# Patient Record
Sex: Male | Born: 1950 | Race: Black or African American | Hispanic: No | Marital: Married | Smoking: Current every day smoker
Health system: Southern US, Community
[De-identification: ages and names within clinical notes are randomized; demographics above are authoritative.]

## PROBLEM LIST (undated history)

## (undated) DIAGNOSIS — K219 Gastro-esophageal reflux disease without esophagitis: Secondary | ICD-10-CM

## (undated) HISTORY — PX: BACK SURGERY: SHX140

---

## 1998-02-05 ENCOUNTER — Encounter: Payer: Self-pay | Admitting: Emergency Medicine

## 1998-02-05 ENCOUNTER — Emergency Department (HOSPITAL_COMMUNITY): Admission: EM | Admit: 1998-02-05 | Discharge: 1998-02-05 | Payer: Self-pay | Admitting: Emergency Medicine

## 2001-06-10 ENCOUNTER — Emergency Department (HOSPITAL_COMMUNITY): Admission: EM | Admit: 2001-06-10 | Discharge: 2001-06-10 | Payer: Self-pay | Admitting: Emergency Medicine

## 2001-06-10 ENCOUNTER — Encounter: Payer: Self-pay | Admitting: Emergency Medicine

## 2001-06-11 ENCOUNTER — Ambulatory Visit (HOSPITAL_COMMUNITY): Admission: RE | Admit: 2001-06-11 | Discharge: 2001-06-11 | Payer: Self-pay | Admitting: Emergency Medicine

## 2001-06-11 ENCOUNTER — Encounter: Payer: Self-pay | Admitting: Emergency Medicine

## 2001-10-10 ENCOUNTER — Ambulatory Visit (HOSPITAL_COMMUNITY): Admission: RE | Admit: 2001-10-10 | Discharge: 2001-10-11 | Payer: Self-pay | Admitting: Neurosurgery

## 2001-10-10 ENCOUNTER — Encounter: Payer: Self-pay | Admitting: Neurosurgery

## 2006-08-05 ENCOUNTER — Emergency Department (HOSPITAL_COMMUNITY): Admission: EM | Admit: 2006-08-05 | Discharge: 2006-08-05 | Payer: Self-pay | Admitting: Emergency Medicine

## 2011-10-08 ENCOUNTER — Emergency Department (HOSPITAL_COMMUNITY)
Admission: EM | Admit: 2011-10-08 | Discharge: 2011-10-08 | Disposition: A | Discharge status: Home / Self Care | Payer: Self-pay | Attending: Emergency Medicine | Admitting: Emergency Medicine

## 2011-10-08 ENCOUNTER — Encounter (HOSPITAL_COMMUNITY): Payer: Self-pay | Admitting: *Deleted

## 2011-10-08 DIAGNOSIS — R1013 Epigastric pain: Secondary | ICD-10-CM | POA: Insufficient documentation

## 2011-10-08 DIAGNOSIS — K219 Gastro-esophageal reflux disease without esophagitis: Secondary | ICD-10-CM | POA: Insufficient documentation

## 2011-10-08 DIAGNOSIS — F17200 Nicotine dependence, unspecified, uncomplicated: Secondary | ICD-10-CM | POA: Insufficient documentation

## 2011-10-08 DIAGNOSIS — F1010 Alcohol abuse, uncomplicated: Secondary | ICD-10-CM | POA: Insufficient documentation

## 2011-10-08 DIAGNOSIS — R112 Nausea with vomiting, unspecified: Secondary | ICD-10-CM | POA: Insufficient documentation

## 2011-10-08 DIAGNOSIS — R109 Unspecified abdominal pain: Secondary | ICD-10-CM

## 2011-10-08 HISTORY — DX: Gastro-esophageal reflux disease without esophagitis: K21.9

## 2011-10-08 LAB — CBC WITH DIFFERENTIAL/PLATELET
Basophils Absolute: 0 10*3/uL (ref 0.0–0.1)
Eosinophils Absolute: 0.1 10*3/uL (ref 0.0–0.7)
Eosinophils Relative: 1 % (ref 0–5)
HCT: 39.3 % (ref 39.0–52.0)
Lymphocytes Relative: 23 % (ref 12–46)
MCH: 33.6 pg (ref 26.0–34.0)
MCV: 94.2 fL (ref 78.0–100.0)
Monocytes Absolute: 0.7 10*3/uL (ref 0.1–1.0)
Platelets: 266 10*3/uL (ref 150–400)
RDW: 13.1 % (ref 11.5–15.5)

## 2011-10-08 LAB — COMPREHENSIVE METABOLIC PANEL
ALT: 15 U/L (ref 0–53)
AST: 21 U/L (ref 0–37)
CO2: 26 mEq/L (ref 19–32)
Calcium: 9 mg/dL (ref 8.4–10.5)
Creatinine, Ser: 0.78 mg/dL (ref 0.50–1.35)
GFR calc Af Amer: >90 mL/min (ref >90)
GFR calc non Af Amer: >90 mL/min (ref >90)
Glucose, Bld: 100 mg/dL — ABNORMAL HIGH (ref 70–99)
Sodium: 141 mEq/L (ref 135–145)
Total Protein: 7 g/dL (ref 6.0–8.3)

## 2011-10-08 MED ORDER — OMEPRAZOLE 20 MG PO CPDR: 20.0000 mg | DELAYED_RELEASE_CAPSULE | Freq: Every day | ORAL | Status: DC

## 2011-10-08 MED ORDER — PROMETHAZINE HCL 25 MG PO TABS: 25.0000 mg | ORAL_TABLET | Freq: Four times a day (QID) | ORAL | Status: DC | PRN

## 2011-10-08 NOTE — ED Notes (Signed)
Pt reports losing 20 pounds since January- pt reports decreased appetite. Pt also reports taking OTC Zantac for GERD and has had issues with reflux for years. Pt denies NSAID use. Pt reports epigastric pain that is worse with food. Pt denies N/V or changes in stool.  Pt denies seeing a health care provider for this issue.  Pt reports history of being seen in ER for reflux issues.

## 2011-10-08 NOTE — Progress Notes (Signed)
Pt seen by [facility] in [facility] ED during his visit and provided with resources  

## 2011-10-08 NOTE — ED Provider Notes (Addendum)
History     CSN: [id number]  Arrival date & time 10/08/11  1209   First MD Initiated Contact with Patient 10/08/11 1240      Chief Complaint  Patient presents with  . Weight Loss  . Gastrophageal Reflux  . Abdominal Pain    (Consider location/radiation/quality/duration/timing/severity/associated sxs/prior treatment) The history is provided by the patient and the spouse.  pt is a 7 y male who takes zantac for GERD.  He c/o postprandial epigastric abd pain with n/v.  sxs for several mos and so severe that he has had a 20 ll involuntary wt loss. He smokes and drinks etoh daily.  He has a np cough.  He denies night sweats, sob.  No diarrhea or f/c.  He denies blood in his emesis, stool or sputum.  He says he "smokes reefer" to stimulate his appetite.  He does not want pain meds now.   Past Medical History  Diagnosis Date  . Acid reflux     Past Surgical History  Procedure Date  . Back surgery     No family history on file.  History  Substance Use Topics  . Smoking status: Not on file  . Smokeless tobacco: Not on file  . Alcohol Use: 3.6 oz/week    6 Cans of beer per week      Review of Systems  Constitutional: Positive for appetite change and unexpected weight change. Negative for fever and chills.  Eyes: Negative for visual disturbance.  Respiratory: Negative for cough, chest tightness and shortness of breath.   Cardiovascular: Negative for chest pain, palpitations and leg swelling.  Gastrointestinal: Positive for nausea, vomiting and abdominal pain. Negative for diarrhea, constipation and blood in stool.  Skin: Negative for rash.  Neurological: Negative for headaches.  Hematological: Does not bruise/bleed easily.  Psychiatric/Behavioral: Negative for confusion.  All other systems reviewed and are negative.    Allergies  Review of patient's allergies indicates no known allergies.  Home Medications   Current Outpatient Rx  Name Route Sig Dispense Refill  .  ADULT MULTIVITAMIN W/MINERALS CH Oral Take 1 tablet by mouth daily.    Marland Kitchen NAPROXEN SODIUM 220 MG PO TABS Oral Take 220 mg by mouth 2 (two) times daily with a meal.    . RANITIDINE HCL 150 MG PO TABS Oral Take 150-300 mg by mouth 4 (four) times daily as needed.       BP 187/104  Pulse 82  Temp 98.7 F (37.1 C) (Oral)  Resp 18  Ht 5\' 9"  (1.753 m)  Wt 138 lb (62.596 kg)  BMI 20.38 kg/m2  SpO2 100%  Physical Exam  Nursing note and vitals reviewed. Constitutional: He is oriented to person, place, and time. He appears well-developed and well-nourished.  HENT:  Head: Normocephalic and atraumatic.  Eyes: Conjunctivae and EOM are normal.  Neck: Normal range of motion. Neck supple.  Cardiovascular: Normal rate and regular rhythm.   No murmur heard. Pulmonary/Chest: Effort normal and breath sounds normal. He has no wheezes. He has no rales.  Abdominal: Soft. Bowel sounds are normal. He exhibits no distension. There is no rebound and no guarding.  Musculoskeletal: Normal range of motion.  Neurological: He is alert and oriented to person, place, and time.  Skin: Skin is warm and dry.  Psychiatric: He has a normal mood and affect. Thought content normal.    ED Course  Procedures (including critical care time)20 lb involuntary wt loss over 6 mos.  Intermittent postprandial abd pain with n/v.  No pain now. No acute abdomen.  Will do screening labs.     Labs Reviewed  CBC WITH DIFFERENTIAL  COMPREHENSIVE METABOLIC PANEL  LIPASE, BLOOD   No results found.   No diagnosis found.    MDM  Involuntary wt loss Postprandial abd pain and n/v        Cheri Guppy, MD 10/08/11 1258  Cheri Guppy, MD 10/08/11 [phone]

## 2016-05-06 ENCOUNTER — Encounter (HOSPITAL_COMMUNITY): Payer: Self-pay

## 2016-05-06 ENCOUNTER — Emergency Department (HOSPITAL_COMMUNITY): Payer: Medicare Other

## 2016-05-06 ENCOUNTER — Emergency Department (HOSPITAL_COMMUNITY)
Admission: EM | Admit: 2016-05-06 | Discharge: 2016-05-06 | Disposition: A | Discharge status: Home / Self Care | Payer: Medicare Other | Attending: Emergency Medicine | Admitting: Emergency Medicine

## 2016-05-06 DIAGNOSIS — Y92410 Unspecified street and highway as the place of occurrence of the external cause: Secondary | ICD-10-CM | POA: Diagnosis not present

## 2016-05-06 DIAGNOSIS — Y999 Unspecified external cause status: Secondary | ICD-10-CM | POA: Insufficient documentation

## 2016-05-06 DIAGNOSIS — Y9389 Activity, other specified: Secondary | ICD-10-CM | POA: Diagnosis not present

## 2016-05-06 DIAGNOSIS — S20212A Contusion of left front wall of thorax, initial encounter: Secondary | ICD-10-CM

## 2016-05-06 DIAGNOSIS — S40012A Contusion of left shoulder, initial encounter: Secondary | ICD-10-CM | POA: Diagnosis not present

## 2016-05-06 DIAGNOSIS — R55 Syncope and collapse: Secondary | ICD-10-CM | POA: Diagnosis not present

## 2016-05-06 DIAGNOSIS — F17200 Nicotine dependence, unspecified, uncomplicated: Secondary | ICD-10-CM | POA: Diagnosis not present

## 2016-05-06 DIAGNOSIS — S4992XA Unspecified injury of left shoulder and upper arm, initial encounter: Secondary | ICD-10-CM | POA: Diagnosis present

## 2016-05-06 DIAGNOSIS — S2020XA Contusion of thorax, unspecified, initial encounter: Secondary | ICD-10-CM | POA: Insufficient documentation

## 2016-05-06 DIAGNOSIS — E041 Nontoxic single thyroid nodule: Secondary | ICD-10-CM
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
## 2016-05-06 NOTE — Discharge Instructions (Signed)
Take acetaminophen or ibuprofen as needed for pain.  Your CT scan showed a nodule on the left side. This will need additional testing to determine how to treat it. The radiologist recommends a thyroid ultrasound and possible biopsy of the nodule. You will need to work through your primary care physician to set this up. If unable to establish a primary care physician, try going through [facility].
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
## 2016-08-12 ENCOUNTER — Encounter (HOSPITAL_COMMUNITY): Payer: Self-pay | Admitting: Emergency Medicine

## 2016-08-12 ENCOUNTER — Emergency Department (HOSPITAL_COMMUNITY)
Admission: EM | Admit: 2016-08-12 | Discharge: 2016-08-12 | Disposition: A | Discharge status: Home / Self Care | Payer: Medicare Other | Attending: Emergency Medicine | Admitting: Emergency Medicine

## 2016-08-12 ENCOUNTER — Emergency Department (HOSPITAL_COMMUNITY): Payer: Medicare Other

## 2016-08-12 DIAGNOSIS — R42 Dizziness and giddiness: Secondary | ICD-10-CM | POA: Diagnosis present

## 2016-08-12 DIAGNOSIS — F17200 Nicotine dependence, unspecified, uncomplicated: Secondary | ICD-10-CM | POA: Insufficient documentation

## 2016-08-12 DIAGNOSIS — R5383 Other fatigue: Secondary | ICD-10-CM | POA: Diagnosis not present

## 2016-08-12 DIAGNOSIS — E860 Dehydration: Secondary | ICD-10-CM | POA: Diagnosis not present

## 2016-08-12 LAB — COMPREHENSIVE METABOLIC PANEL
ALT: 18 U/L (ref 17–63)
AST: 29 U/L (ref 15–41)
Albumin: 3.8 g/dL (ref 3.5–5.0)
Alkaline Phosphatase: 93 U/L (ref 38–126)
Anion gap: 8 (ref 5–15)
BUN: 16 mg/dL (ref 6–20)
CHLORIDE: 106 mmol/L (ref 101–111)
CO2: 25 mmol/L (ref 22–32)
CREATININE: 0.94 mg/dL (ref 0.61–1.24)
Calcium: 9 mg/dL (ref 8.9–10.3)
GFR calc Af Amer: >60 mL/min (ref >60)
Glucose, Bld: 121 mg/dL — ABNORMAL HIGH (ref 65–99)
POTASSIUM: 4 mmol/L (ref 3.5–5.1)
SODIUM: 139 mmol/L (ref 135–145)
Total Bilirubin: 0.3 mg/dL (ref 0.3–1.2)
Total Protein: 7.3 g/dL (ref 6.5–8.1)

## 2016-08-12 LAB — CBC WITH DIFFERENTIAL/PLATELET
Basophils Absolute: 0 10*3/uL (ref 0.0–0.1)
Basophils Relative: 0 %
EOS PCT: 3 %
Eosinophils Absolute: 0.1 10*3/uL (ref 0.0–0.7)
HCT: 38.4 % — ABNORMAL LOW (ref 39.0–52.0)
Hemoglobin: 13.6 g/dL (ref 13.0–17.0)
LYMPHS ABS: 2.1 10*3/uL (ref 0.7–4.0)
LYMPHS PCT: 42 %
MCH: 33 pg (ref 26.0–34.0)
MCHC: 35.4 g/dL (ref 30.0–36.0)
MCV: 93.2 fL (ref 78.0–100.0)
MONO ABS: 0.4 10*3/uL (ref 0.1–1.0)
Monocytes Relative: 7 %
Neutro Abs: 2.4 10*3/uL (ref 1.7–7.7)
Neutrophils Relative %: 48 %
PLATELETS: 316 10*3/uL (ref 150–400)
RBC: 4.12 MIL/uL — ABNORMAL LOW (ref 4.22–5.81)
RDW: 13 % (ref 11.5–15.5)
WBC: 4.9 10*3/uL (ref 4.0–10.5)

## 2016-08-12 LAB — URINALYSIS, ROUTINE W REFLEX MICROSCOPIC
Bilirubin Urine: NEGATIVE
Glucose, UA: NEGATIVE mg/dL
Hgb urine dipstick: NEGATIVE
Ketones, ur: NEGATIVE mg/dL
LEUKOCYTES UA: NEGATIVE
NITRITE: NEGATIVE
PH: 5 (ref 5.0–8.0)
Protein, ur: NEGATIVE mg/dL
SPECIFIC GRAVITY, URINE: 1.03 (ref 1.005–1.030)

## 2016-08-12 MED ORDER — SODIUM CHLORIDE 0.9 % IV BOLUS (SEPSIS)
1000.0000 mL | Freq: Once | INTRAVENOUS | Status: AC
  Administered 2016-08-12: 1000 mL via INTRAVENOUS
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
## 2016-08-12 NOTE — ED Triage Notes (Signed)
Pt c/o dizziness and fatigue x greater than 1 week. Pt falling asleep in triage when being asked questions. Pt denies any other symptoms and states he has not made any changes to lifestyle.

## 2016-08-12 NOTE — ED Notes (Signed)
Pt appeared to be in deep sleep when I returned to the room to start IV and get labs.  Pt woke up briefly but fell back asleep during this.  Pt then woke briefly during application of EKG electrodes but fell back asleep again. Urinal set at the bedside, pt notified that urine sample is needed
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
## 2019-09-12 ENCOUNTER — Encounter (HOSPITAL_COMMUNITY): Payer: Self-pay | Admitting: Emergency Medicine

## 2019-09-12 ENCOUNTER — Ambulatory Visit (INDEPENDENT_AMBULATORY_CARE_PROVIDER_SITE_OTHER): Payer: Medicare Other

## 2019-09-12 ENCOUNTER — Ambulatory Visit (HOSPITAL_COMMUNITY)
Admission: EM | Admit: 2019-09-12 | Discharge: 2019-09-12 | Disposition: A | Discharge status: Home / Self Care | Payer: Medicare Other | Attending: Family Medicine | Admitting: Family Medicine

## 2019-09-12 ENCOUNTER — Other Ambulatory Visit: Payer: Self-pay

## 2019-09-12 DIAGNOSIS — R634 Abnormal weight loss: Secondary | ICD-10-CM | POA: Insufficient documentation

## 2019-09-12 DIAGNOSIS — F17200 Nicotine dependence, unspecified, uncomplicated: Secondary | ICD-10-CM | POA: Diagnosis not present

## 2019-09-12 DIAGNOSIS — R197 Diarrhea, unspecified: Secondary | ICD-10-CM

## 2019-09-12 DIAGNOSIS — R630 Anorexia: Secondary | ICD-10-CM | POA: Diagnosis not present

## 2019-09-12 DIAGNOSIS — R5383 Other fatigue: Secondary | ICD-10-CM | POA: Diagnosis not present

## 2019-09-12 DIAGNOSIS — R748 Abnormal levels of other serum enzymes: Secondary | ICD-10-CM | POA: Insufficient documentation

## 2019-09-12 LAB — COMPREHENSIVE METABOLIC PANEL
ALT: 27 U/L (ref 0–44)
AST: 42 U/L — ABNORMAL HIGH (ref 15–41)
Albumin: 3.5 g/dL (ref 3.5–5.0)
Alkaline Phosphatase: 93 U/L (ref 38–126)
Anion gap: 9 (ref 5–15)
BUN: 12 mg/dL (ref 8–23)
CO2: 25 mmol/L (ref 22–32)
Calcium: 9 mg/dL (ref 8.9–10.3)
Chloride: 105 mmol/L (ref 98–111)
Creatinine, Ser: 0.99 mg/dL (ref 0.61–1.24)
GFR calc Af Amer: >60 mL/min (ref >60)
GFR calc non Af Amer: >60 mL/min (ref >60)
Glucose, Bld: 106 mg/dL — ABNORMAL HIGH (ref 70–99)
Potassium: 4 mmol/L (ref 3.5–5.1)
Sodium: 139 mmol/L (ref 135–145)
Total Bilirubin: 0.6 mg/dL (ref 0.3–1.2)
Total Protein: 7 g/dL (ref 6.5–8.1)

## 2019-09-12 LAB — CBC WITH DIFFERENTIAL/PLATELET
Abs Immature Granulocytes: 0.01 10*3/uL (ref 0.00–0.07)
Basophils Absolute: 0 10*3/uL (ref 0.0–0.1)
Basophils Relative: 0 %
Eosinophils Absolute: 0 10*3/uL (ref 0.0–0.5)
Eosinophils Relative: 1 %
HCT: 41.6 % (ref 39.0–52.0)
Hemoglobin: 14.5 g/dL (ref 13.0–17.0)
Immature Granulocytes: 0 %
Lymphocytes Relative: 32 %
Lymphs Abs: 1.5 10*3/uL (ref 0.7–4.0)
MCH: 34.3 pg — ABNORMAL HIGH (ref 26.0–34.0)
MCHC: 34.9 g/dL (ref 30.0–36.0)
MCV: 98.3 fL (ref 80.0–100.0)
Monocytes Absolute: 0.5 10*3/uL (ref 0.1–1.0)
Monocytes Relative: 10 %
Neutro Abs: 2.6 10*3/uL (ref 1.7–7.7)
Neutrophils Relative %: 57 %
Platelets: 297 10*3/uL (ref 150–400)
RBC: 4.23 MIL/uL (ref 4.22–5.81)
RDW: 12.7 % (ref 11.5–15.5)
WBC: 4.6 10*3/uL (ref 4.0–10.5)
nRBC: 0 % (ref 0.0–0.2)

## 2019-09-12 LAB — POCT URINALYSIS DIP (DEVICE)
Glucose, UA: NEGATIVE mg/dL
Hgb urine dipstick: NEGATIVE
Ketones, ur: NEGATIVE mg/dL
Leukocytes,Ua: NEGATIVE
Nitrite: NEGATIVE
Protein, ur: 30 mg/dL — AB
Specific Gravity, Urine: >=1.03 (ref 1.005–1.030)
Urobilinogen, UA: 0.2 mg/dL (ref 0.0–1.0)
pH: 6 (ref 5.0–8.0)

## 2019-09-12 LAB — LIPASE, BLOOD: Lipase: 25 U/L (ref 11–51)

## 2019-09-12 LAB — AMYLASE: Amylase: 215 U/L — ABNORMAL HIGH (ref 28–100)

## 2019-09-12 NOTE — ED Provider Notes (Signed)
[facility]    CSN: [id number] Arrival date & time: 09/12/19  1145      History   Chief Complaint Chief Complaint  Patient presents with  . Fatigue  . Weight Loss    HPI Douglas Cordova is a 69 y.o. male.   Patient is a 69 year old male presenting with symptoms of weight loss, diarrhea, decreased appetite, and fatigue x 3-4 months. Symptoms have been progressively worsening. Reports history of gastric reflux and "takes a medication before he eats to help with this." Reports increased thirst, urinary frequency. Denies nausea, vomiting, abdominal pain, burning with urination, blood in urine or stool.   ROS per HPI      Past Medical History:  Diagnosis Date  . Acid reflux     There are no problems to display for this patient.   Past Surgical History:  Procedure Laterality Date  . BACK SURGERY         Home Medications    Prior to Admission medications   Not on File    Family History History reviewed. No pertinent family history.  Social History Social History   Tobacco Use  . Smoking status: Current Every Day Smoker  . Smokeless tobacco: Never Used  Substance Use Topics  . Alcohol use: Yes    Alcohol/week: 6.0 standard drinks    Types: 6 Cans of beer per week  . Drug use: Yes    Frequency: 7.0 times per week    Types: Marijuana     Allergies   Patient has no known allergies.   Review of Systems Review of Systems  Constitutional: Positive for appetite change, fatigue and unexpected weight change. Negative for fever.  HENT: Negative.   Eyes: Negative.   Respiratory: Negative.   Cardiovascular: Negative for chest pain.  Gastrointestinal: Positive for diarrhea. Negative for abdominal distention, abdominal pain, blood in stool, nausea and vomiting.  Endocrine: Positive for polydipsia and polyuria.  Genitourinary: Positive for frequency. Negative for hematuria and urgency.  Musculoskeletal: Negative.   Skin: Negative.     Allergic/Immunologic: Negative.   Neurological: Positive for weakness.       Generalized weakness  Hematological: Negative.   Psychiatric/Behavioral: Negative.      Physical Exam Triage Vital Signs ED Triage Vitals  Enc Vitals Group     BP 09/12/19 1223 (!) 131/96     Pulse Rate 09/12/19 1223 93     Resp 09/12/19 1223 18     Temp 09/12/19 1223 98.3 F (36.8 C)     Temp Source 09/12/19 1223 Oral     SpO2 09/12/19 1223 100 %     Weight 09/12/19 1224 134 lb 9.6 oz (61.1 kg)     Height --      Head Circumference --      Peak Flow --      Pain Score 09/12/19 1223 0     Pain Loc --      Pain Edu? --      Excl. in GC? --    No data found.  Updated Vital Signs BP (!) 131/96 (BP Location: Right Arm)   Pulse 93   Temp 98.3 F (36.8 C) (Oral)   Resp 18   Wt 134 lb 9.6 oz (61.1 kg)   SpO2 100%   BMI 20.47 kg/m   Visual Acuity Right Eye Distance:   Left Eye Distance:   Bilateral Distance:    Right Eye Near:   Left Eye Near:    Bilateral Near:  Physical Exam Constitutional:      General: He is not in acute distress.    Appearance: He is cachectic.  HENT:     Head: Normocephalic.  Cardiovascular:     Rate and Rhythm: Normal rate and regular rhythm.     Pulses: Normal pulses.          Radial pulses are 2+ on the right side and 2+ on the left side.     Heart sounds: Normal heart sounds, S1 normal and S2 normal.  Pulmonary:     Effort: Pulmonary effort is normal. No respiratory distress.     Breath sounds: Normal breath sounds.  Abdominal:     General: Abdomen is flat. Bowel sounds are normal.     Palpations: Abdomen is soft.     Tenderness: There is no abdominal tenderness.  Musculoskeletal:        General: Normal range of motion.     Cervical back: Normal range of motion.  Lymphadenopathy:     Head:     Right side of head: No submental, submandibular or tonsillar adenopathy.     Left side of head: No submental, submandibular or tonsillar adenopathy.      Cervical: No cervical adenopathy.  Skin:    General: Skin is warm and dry.     Capillary Refill: Capillary refill takes less than 2 seconds.  Neurological:     General: No focal deficit present.     Mental Status: He is alert.      UC Treatments / Results  Labs (all labs ordered are listed, but only abnormal results are displayed) Labs Reviewed  COMPREHENSIVE METABOLIC PANEL - Abnormal; Notable for the following components:      Result Value   Glucose, Bld 106 (*)    AST 42 (*)    All other components within normal limits  CBC WITH DIFFERENTIAL/PLATELET - Abnormal; Notable for the following components:   MCH 34.3 (*)    All other components within normal limits  AMYLASE - Abnormal; Notable for the following components:   Amylase 215 (*)    All other components within normal limits  POCT URINALYSIS DIP (DEVICE) - Abnormal; Notable for the following components:   Bilirubin Urine SMALL (*)    Protein, ur 30 (*)    All other components within normal limits  LIPASE, BLOOD    EKG   Radiology DG Abdomen Acute W/Chest  Result Date: 09/12/2019 CLINICAL DATA:  Weight loss, diarrhea, decreased appetite. Smoker. EXAM: DG ABDOMEN ACUTE W/ 1V CHEST COMPARISON:  None. FINDINGS: The cardiomediastinal contours are normal. The lungs are clear. No evidence of pulmonary mass or suspicious nodule. There is no free intra-abdominal air. No dilated bowel loops to suggest obstruction. Small volume of stool throughout the colon. Presumed phleboliths in the pelvis. No radiopaque calculi. No acute osseous abnormalities are seen. IMPRESSION: 1. No explanation for weight loss and diarrhea. 2. Normal bowel gas pattern. Electronically Signed   By: Narda Rutherford M.D.   On: 09/12/2019 14:42    Procedures Procedures (including critical care time)  Medications Ordered in UC Medications - No data to display  Initial Impression / Assessment and Plan / UC Course  I have reviewed the triage vital signs  and the nursing notes.  Pertinent labs & imaging results that were available during my care of the patient were reviewed by me and considered in my medical decision making (see chart for details).     69 year old male presents with 3 to  4 months of worsening with fatigue, weight loss, generalized is not feeling well. Denies any specific pain at this time. Patient smokes a pack a day and reported drinks 2 beers daily. Did CBC with differential, CMP, amylase and lipase Blood work mostly unremarkable besides elevated amylase. X-ray of chest and abdomen without any acute findings. Urine negative Based on the increased amylase lipase has some concern for some underlying pancreatic issue, whether this is chronic pancreatitis versus some sort of pancreatic carcinoma. Recommend follow-up with GI specialist or primary care Contacts put on discharge paperwork for follow-up. No reason to send patient to the ER at this time. Final Clinical Impressions(s) / UC Diagnoses   Final diagnoses:  Elevated amylase  Loss of weight  Fatigue, unspecified type     Discharge Instructions     Your x-ray of your stomach and abdomen did not show anything concerning. The only concern on your blood work was some possible elevation of pancreatic enzyme There is many causes for this.  I would recommend following up with primary care for further management and possible CT of the abdomen or ultrasound of the abdomen. I have also put a referral in for GI specialist They should be giving you a call to set up an appointment.    ED Prescriptions    None     PDMP not reviewed this encounter.   Loura Halt A, NP 09/12/19 1510

## 2019-09-12 NOTE — Discharge Instructions (Addendum)
Your x-ray of your stomach and abdomen did not show anything concerning. The only concern on your blood work was some possible elevation of pancreatic enzyme There is many causes for this.  I would recommend following up with primary care for further management and possible CT of the abdomen or ultrasound of the abdomen. I have also put a referral in for GI specialist They should be giving you a call to set up an appointment.

## 2019-09-12 NOTE — ED Triage Notes (Signed)
Pt sts fatigue and weight loss x 3-4 months with no appetite; pt denies vomiting or other sx

## 2019-09-14 ENCOUNTER — Encounter: Payer: Self-pay | Admitting: Physician Assistant

## 2019-10-18 ENCOUNTER — Ambulatory Visit: Payer: Medicare Other | Admitting: Physician Assistant

## 2020-04-24 ENCOUNTER — Other Ambulatory Visit: Payer: Self-pay | Admitting: Physician Assistant

## 2020-04-24 DIAGNOSIS — R634 Abnormal weight loss: Secondary | ICD-10-CM

## 2020-04-24 DIAGNOSIS — R1013 Epigastric pain: Secondary | ICD-10-CM

## 2020-05-08 ENCOUNTER — Other Ambulatory Visit: Payer: Medicare Other

## 2020-06-18 ENCOUNTER — Inpatient Hospital Stay: Admission: RE | Admit: 2020-06-18 | Payer: Medicare Other | Source: Ambulatory Visit

## 2021-08-07 IMAGING — DX DG ABDOMEN ACUTE W/ 1V CHEST
3 series · 3 of 3 positions shown · non-contrast
Comparison: None.

CLINICAL DATA: Weight loss, diarrhea, decreased appetite. Smoker.

EXAM:
DG ABDOMEN ACUTE W/ 1V CHEST

[chest pa]
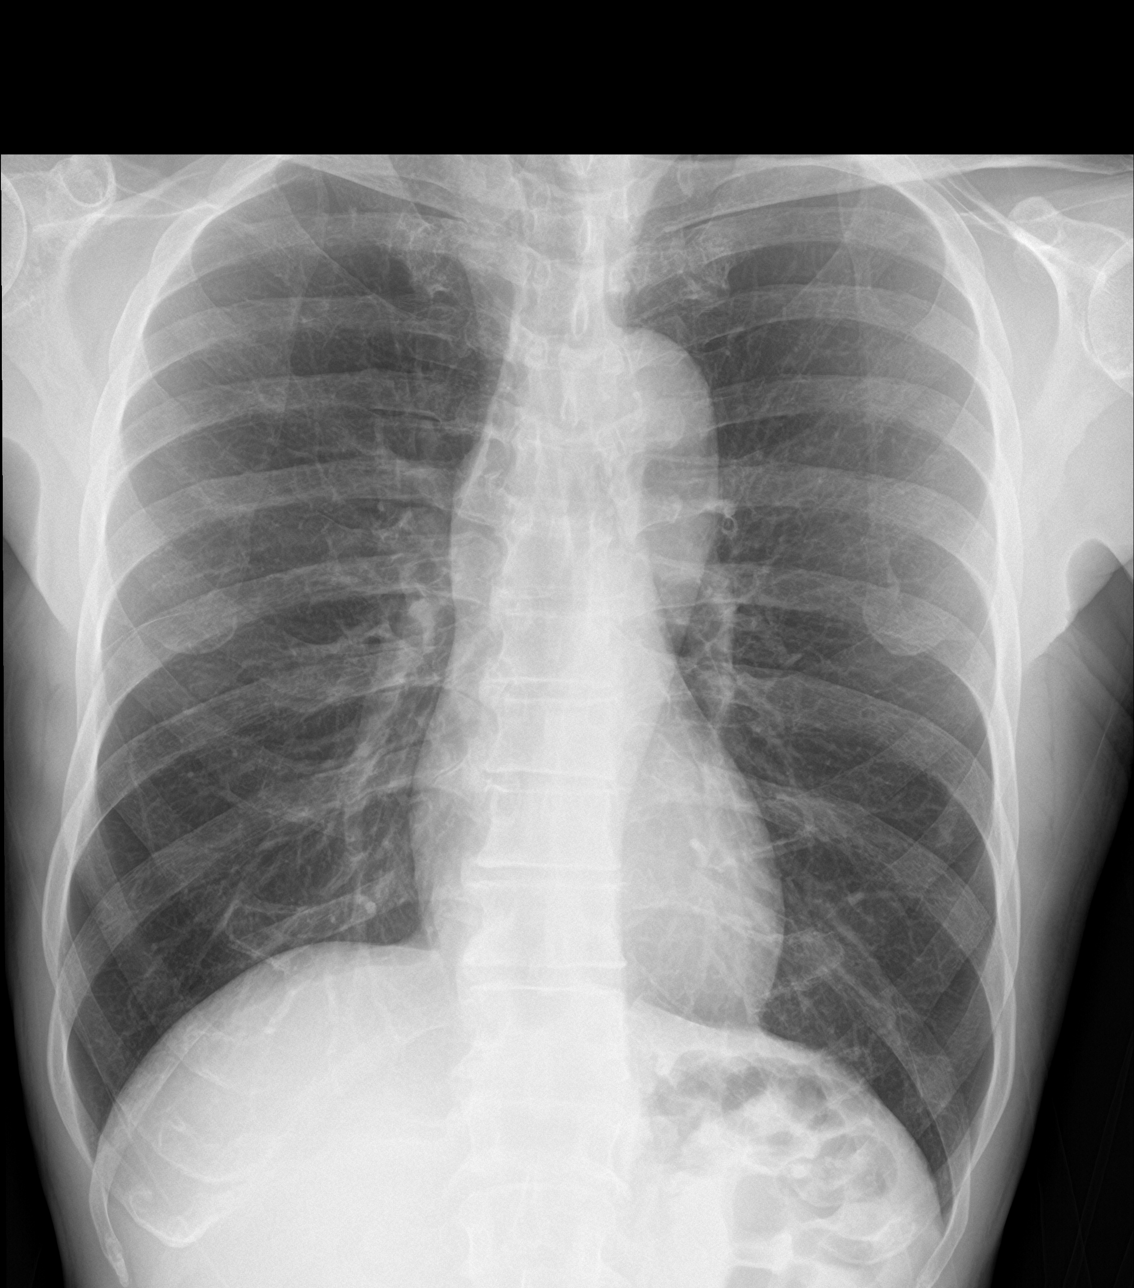

[abdomen erect]
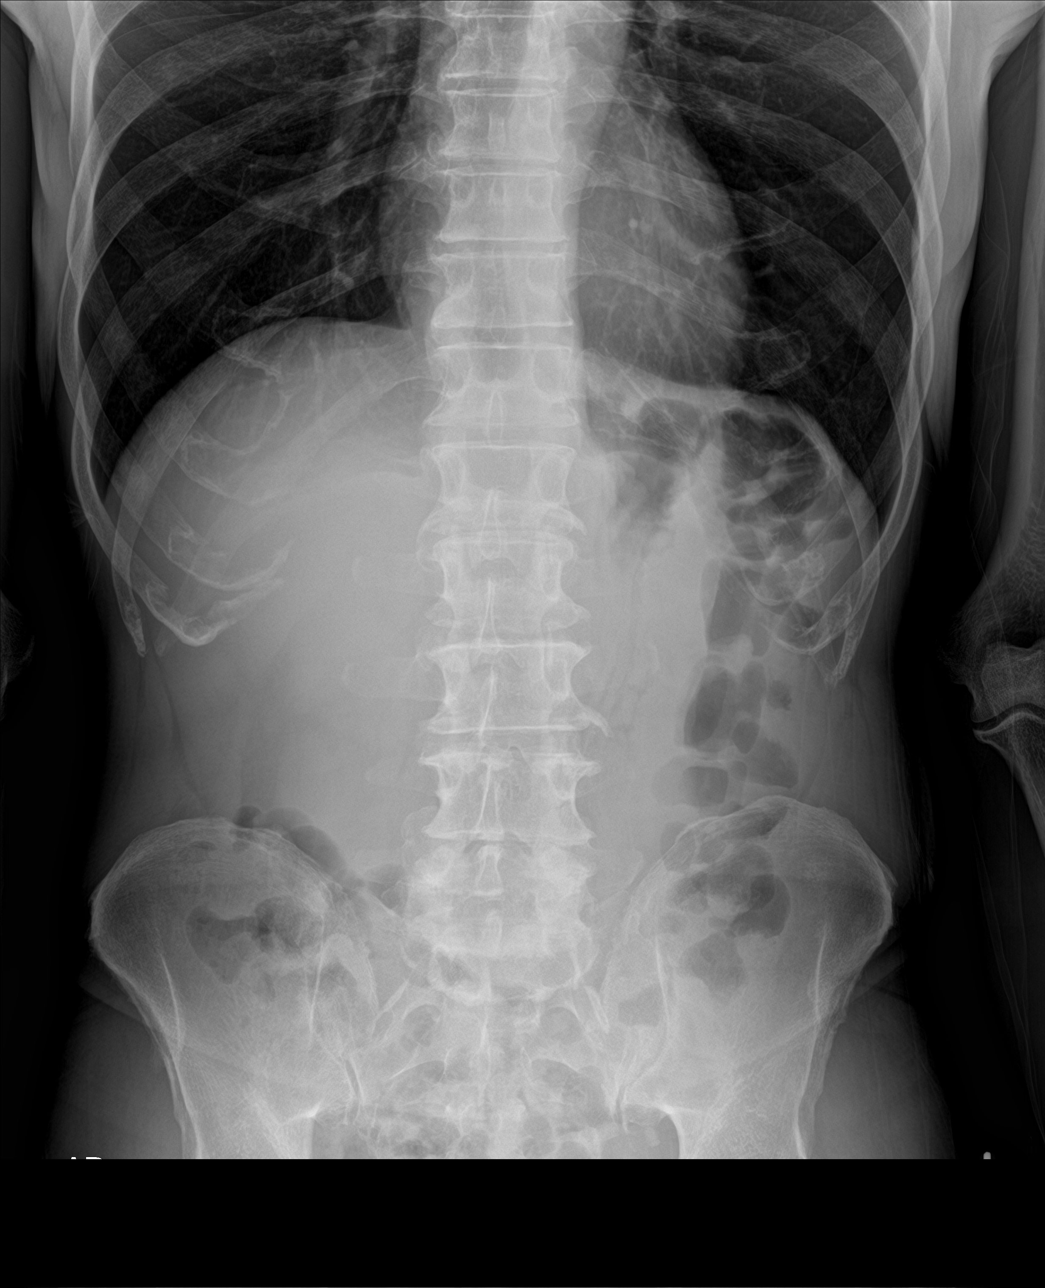

[abdomen supine]
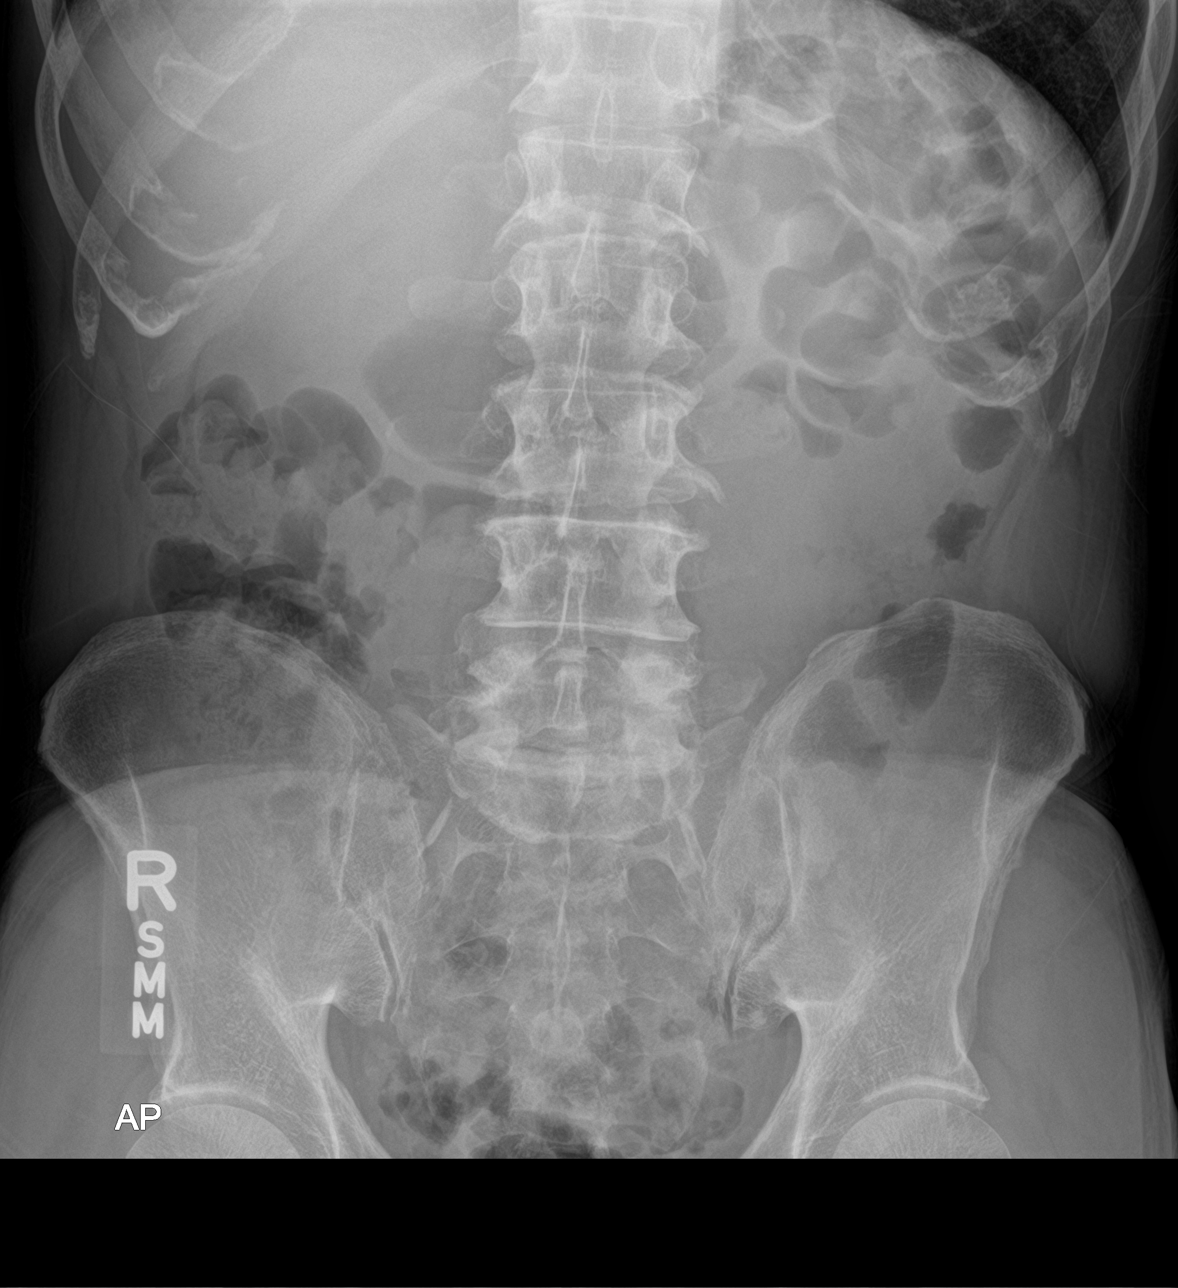

[3 of 3 positions shown; findings below may reference images not displayed]

FINDINGS: The cardiomediastinal contours are normal. The lungs are clear. No
evidence of pulmonary mass or suspicious nodule. There is no free
intra-abdominal air. No dilated bowel loops to suggest obstruction.
Small volume of stool throughout the colon. Presumed phleboliths in
the pelvis. No radiopaque calculi. No acute osseous abnormalities
are seen.
IMPRESSION: 1. No explanation for weight loss and diarrhea.
2. Normal bowel gas pattern.

## 2022-03-11 ENCOUNTER — Other Ambulatory Visit: Payer: Self-pay | Admitting: Student

## 2022-03-11 DIAGNOSIS — F17210 Nicotine dependence, cigarettes, uncomplicated: Secondary | ICD-10-CM

## 2022-07-23 ENCOUNTER — Encounter: Payer: Self-pay | Admitting: Student

## 2023-07-19 ENCOUNTER — Other Ambulatory Visit: Payer: Self-pay

## 2023-07-19 ENCOUNTER — Emergency Department (HOSPITAL_COMMUNITY)

## 2023-07-19 ENCOUNTER — Emergency Department (HOSPITAL_COMMUNITY)
Admission: EM | Admit: 2023-07-19 | Discharge: 2023-07-20 | Discharge status: Against Medical Advice | Attending: Emergency Medicine | Admitting: Emergency Medicine

## 2023-07-19 DIAGNOSIS — Z5321 Procedure and treatment not carried out due to patient leaving prior to being seen by health care provider: Secondary | ICD-10-CM | POA: Insufficient documentation

## 2023-07-19 DIAGNOSIS — R079 Chest pain, unspecified: Secondary | ICD-10-CM | POA: Insufficient documentation

## 2023-07-19 LAB — BASIC METABOLIC PANEL WITH GFR
Anion gap: 8 (ref 5–15)
BUN: 18 mg/dL (ref 8–23)
CO2: 27 mmol/L (ref 22–32)
Calcium: 9.3 mg/dL (ref 8.9–10.3)
Chloride: 104 mmol/L (ref 98–111)
Creatinine, Ser: 0.99 mg/dL (ref 0.61–1.24)
GFR, Estimated: >60 mL/min (ref >60)
Glucose, Bld: 104 mg/dL — ABNORMAL HIGH (ref 70–99)
Potassium: 4.3 mmol/L (ref 3.5–5.1)
Sodium: 139 mmol/L (ref 135–145)

## 2023-07-19 LAB — CBC
HCT: 43.6 % (ref 39.0–52.0)
Hemoglobin: 14.8 g/dL (ref 13.0–17.0)
MCH: 33.6 pg (ref 26.0–34.0)
MCHC: 33.9 g/dL (ref 30.0–36.0)
MCV: 98.9 fL (ref 80.0–100.0)
Platelets: 266 10*3/uL (ref 150–400)
RBC: 4.41 MIL/uL (ref 4.22–5.81)
RDW: 12.8 % (ref 11.5–15.5)
WBC: 4.5 10*3/uL (ref 4.0–10.5)
nRBC: 0 % (ref 0.0–0.2)

## 2023-07-19 LAB — TROPONIN I (HIGH SENSITIVITY): Troponin I (High Sensitivity): 5 ng/L (ref <18)

## 2023-07-19 NOTE — ED Triage Notes (Signed)
 PT BIB EMS from home with complaints of chest pain  on and off for a few weeks worse the past hour. Worse with deep breath. Denies any complaints at this time.   80 SR  1 nitroglycerin with significant relief.

## 2023-07-19 NOTE — ED Provider Triage Note (Signed)
 Emergency Medicine Provider Triage Evaluation Note  Douglas Cordova , a 73 y.o. male  was evaluated in triage.  Pt complains of chest pain x 6 months. Worse today, received nitroglycerin with EMS with complete resolution. States pain is worse on days when he doesn't go to the gym. No shortness of breath. Currently completely asymptomatic.  Review of Systems  Positive:  Negative:   Physical Exam  BP (!) 157/94   Pulse 84   Temp 98.4 F (36.9 C)   Resp 17   Wt 61 kg   SpO2 99%   BMI 20.45 kg/m  Gen:   Awake, no distress   Resp:  Normal effort  MSK:   Moves extremities without difficulty  Other:    Medical Decision Making  Medically screening exam initiated at 4:13 PM.  Appropriate orders placed.  Douglas Cordova was informed that the remainder of the evaluation will be completed by another provider, this initial triage assessment does not replace that evaluation, and the importance of remaining in the ED until their evaluation is complete.     Sherra Dk, PA-C 07/19/23 1614

## 2023-07-19 NOTE — ED Notes (Signed)
 Pt called multiple times for updated vitals, no response
